# Patient Record
Sex: Female | Born: 1960 | Race: White | Hispanic: No | Marital: Married | Smoking: Never smoker
Health system: Southern US, Community
[De-identification: ages and names within clinical notes are randomized; demographics above are authoritative.]

## PROBLEM LIST (undated history)

## (undated) DIAGNOSIS — F419 Anxiety disorder, unspecified: Secondary | ICD-10-CM

## (undated) HISTORY — PX: ANKLE SURGERY: SHX546

## (undated) HISTORY — PX: BACK SURGERY: SHX140

---

## 2004-02-21 ENCOUNTER — Encounter: Admission: RE | Admit: 2004-02-21 | Discharge: 2004-02-21 | Payer: Self-pay | Admitting: Family Medicine

## 2005-04-09 ENCOUNTER — Ambulatory Visit (HOSPITAL_COMMUNITY): Admission: RE | Admit: 2005-04-09 | Discharge: 2005-04-09 | Payer: Self-pay | Admitting: Family Medicine

## 2006-01-29 ENCOUNTER — Other Ambulatory Visit: Admission: RE | Admit: 2006-01-29 | Discharge: 2006-01-29 | Payer: Self-pay | Admitting: Family Medicine

## 2006-09-22 ENCOUNTER — Emergency Department (HOSPITAL_COMMUNITY): Admission: EM | Admit: 2006-09-22 | Discharge: 2006-09-22 | Payer: Self-pay | Admitting: Family Medicine

## 2008-08-06 ENCOUNTER — Emergency Department (HOSPITAL_COMMUNITY): Admission: EM | Admit: 2008-08-06 | Discharge: 2008-08-06 | Payer: Self-pay | Admitting: Emergency Medicine

## 2008-08-22 ENCOUNTER — Encounter: Payer: Self-pay | Admitting: Pulmonary Disease

## 2008-09-06 DIAGNOSIS — J309 Allergic rhinitis, unspecified: Secondary | ICD-10-CM | POA: Insufficient documentation

## 2008-09-06 DIAGNOSIS — F329 Major depressive disorder, single episode, unspecified: Secondary | ICD-10-CM

## 2008-09-07 ENCOUNTER — Ambulatory Visit: Payer: Self-pay | Admitting: Pulmonary Disease

## 2008-09-14 ENCOUNTER — Telehealth: Payer: Self-pay | Admitting: Pulmonary Disease

## 2008-09-19 ENCOUNTER — Ambulatory Visit: Admission: RE | Admit: 2008-09-19 | Discharge: 2008-09-19 | Payer: Self-pay | Admitting: Pulmonary Disease

## 2008-09-19 ENCOUNTER — Ambulatory Visit: Payer: Self-pay | Admitting: Pulmonary Disease

## 2008-09-26 ENCOUNTER — Encounter: Admission: RE | Admit: 2008-09-26 | Discharge: 2008-12-01 | Payer: Self-pay | Admitting: Pulmonary Disease

## 2008-10-04 ENCOUNTER — Encounter: Payer: Self-pay | Admitting: Pulmonary Disease

## 2008-10-07 ENCOUNTER — Ambulatory Visit: Payer: Self-pay | Admitting: Pulmonary Disease

## 2008-10-07 DIAGNOSIS — K219 Gastro-esophageal reflux disease without esophagitis: Secondary | ICD-10-CM | POA: Insufficient documentation

## 2008-10-07 DIAGNOSIS — J383 Other diseases of vocal cords: Secondary | ICD-10-CM | POA: Insufficient documentation

## 2008-12-22 ENCOUNTER — Encounter: Payer: Self-pay | Admitting: Pulmonary Disease

## 2008-12-27 ENCOUNTER — Encounter: Admission: RE | Admit: 2008-12-27 | Discharge: 2008-12-27 | Payer: Self-pay | Admitting: Family Medicine

## 2009-01-23 ENCOUNTER — Ambulatory Visit (HOSPITAL_COMMUNITY): Admission: RE | Admit: 2009-01-23 | Discharge: 2009-01-24 | Payer: Self-pay | Admitting: Orthopaedic Surgery

## 2009-04-03 ENCOUNTER — Encounter: Admission: RE | Admit: 2009-04-03 | Discharge: 2009-04-03 | Payer: Self-pay | Admitting: Orthopaedic Surgery

## 2009-09-18 ENCOUNTER — Encounter: Admission: RE | Admit: 2009-09-18 | Discharge: 2009-09-18 | Payer: Self-pay | Admitting: Orthopaedic Surgery

## 2009-09-27 ENCOUNTER — Ambulatory Visit (HOSPITAL_COMMUNITY): Admission: RE | Admit: 2009-09-27 | Discharge: 2009-09-28 | Payer: Self-pay | Admitting: Orthopaedic Surgery

## 2009-11-30 ENCOUNTER — Inpatient Hospital Stay (HOSPITAL_COMMUNITY): Admission: RE | Admit: 2009-11-30 | Discharge: 2009-12-02 | Payer: Self-pay | Admitting: Orthopaedic Surgery

## 2010-04-18 ENCOUNTER — Other Ambulatory Visit: Admission: RE | Admit: 2010-04-18 | Discharge: 2010-04-18 | Payer: Self-pay | Admitting: Family Medicine

## 2011-02-17 LAB — BASIC METABOLIC PANEL
CO2: 26 mEq/L (ref 19–32)
Chloride: 106 mEq/L (ref 96–112)
GFR calc Af Amer: >60 mL/min (ref >60)
Sodium: 138 mEq/L (ref 135–145)

## 2011-02-17 LAB — CBC
Hemoglobin: 9.9 g/dL — ABNORMAL LOW (ref 12.0–15.0)
MCHC: 34.7 g/dL (ref 30.0–36.0)
MCV: 88.8 fL (ref 78.0–100.0)
RBC: 3.21 MIL/uL — ABNORMAL LOW (ref 3.87–5.11)

## 2011-02-24 IMAGING — CR DG LUMBAR SPINE 2-3V
2 series · 2 of 2 positions shown · non-contrast
Comparison: 09/27/2009.

CLINICAL DATA: 48-year-old female undergoing lumbar surgery.
Incorrect instrument/sponge count.

LUMBAR SPINE - 2-3 VIEW

[view not recorded (1 of 2)]
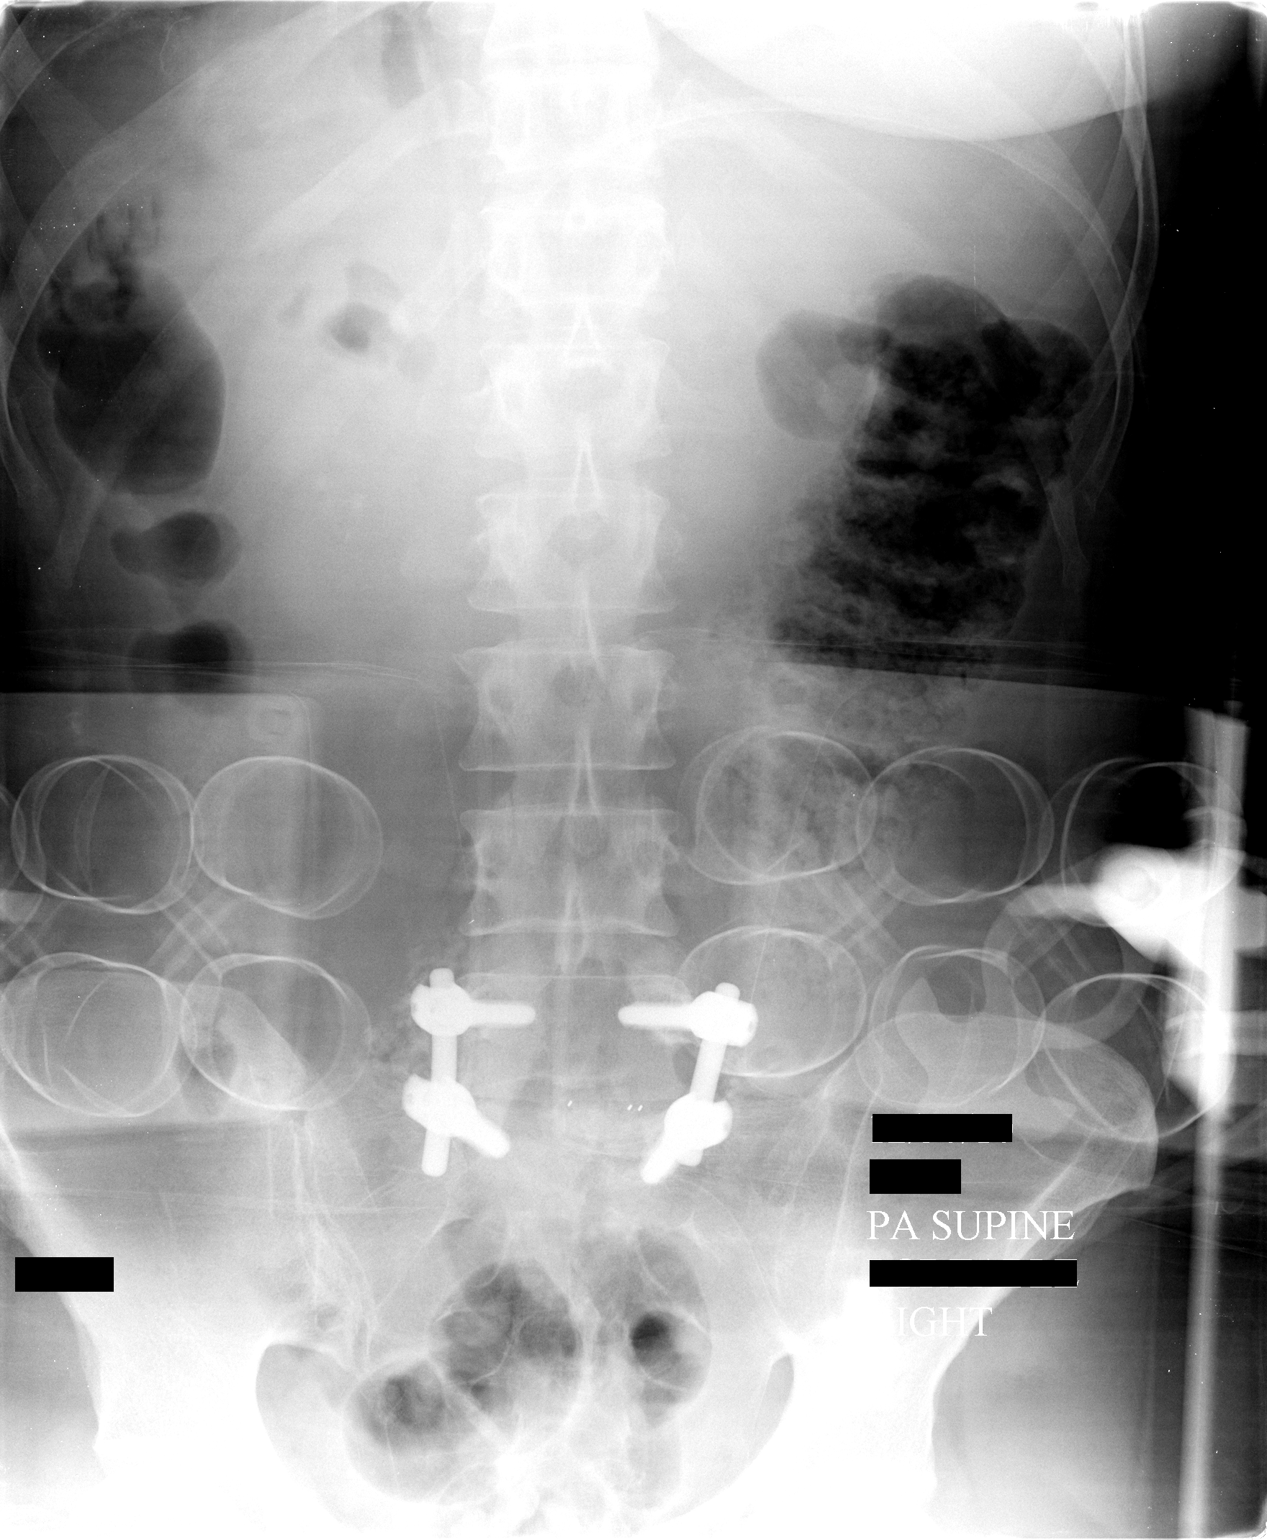

[view not recorded (2 of 2)]
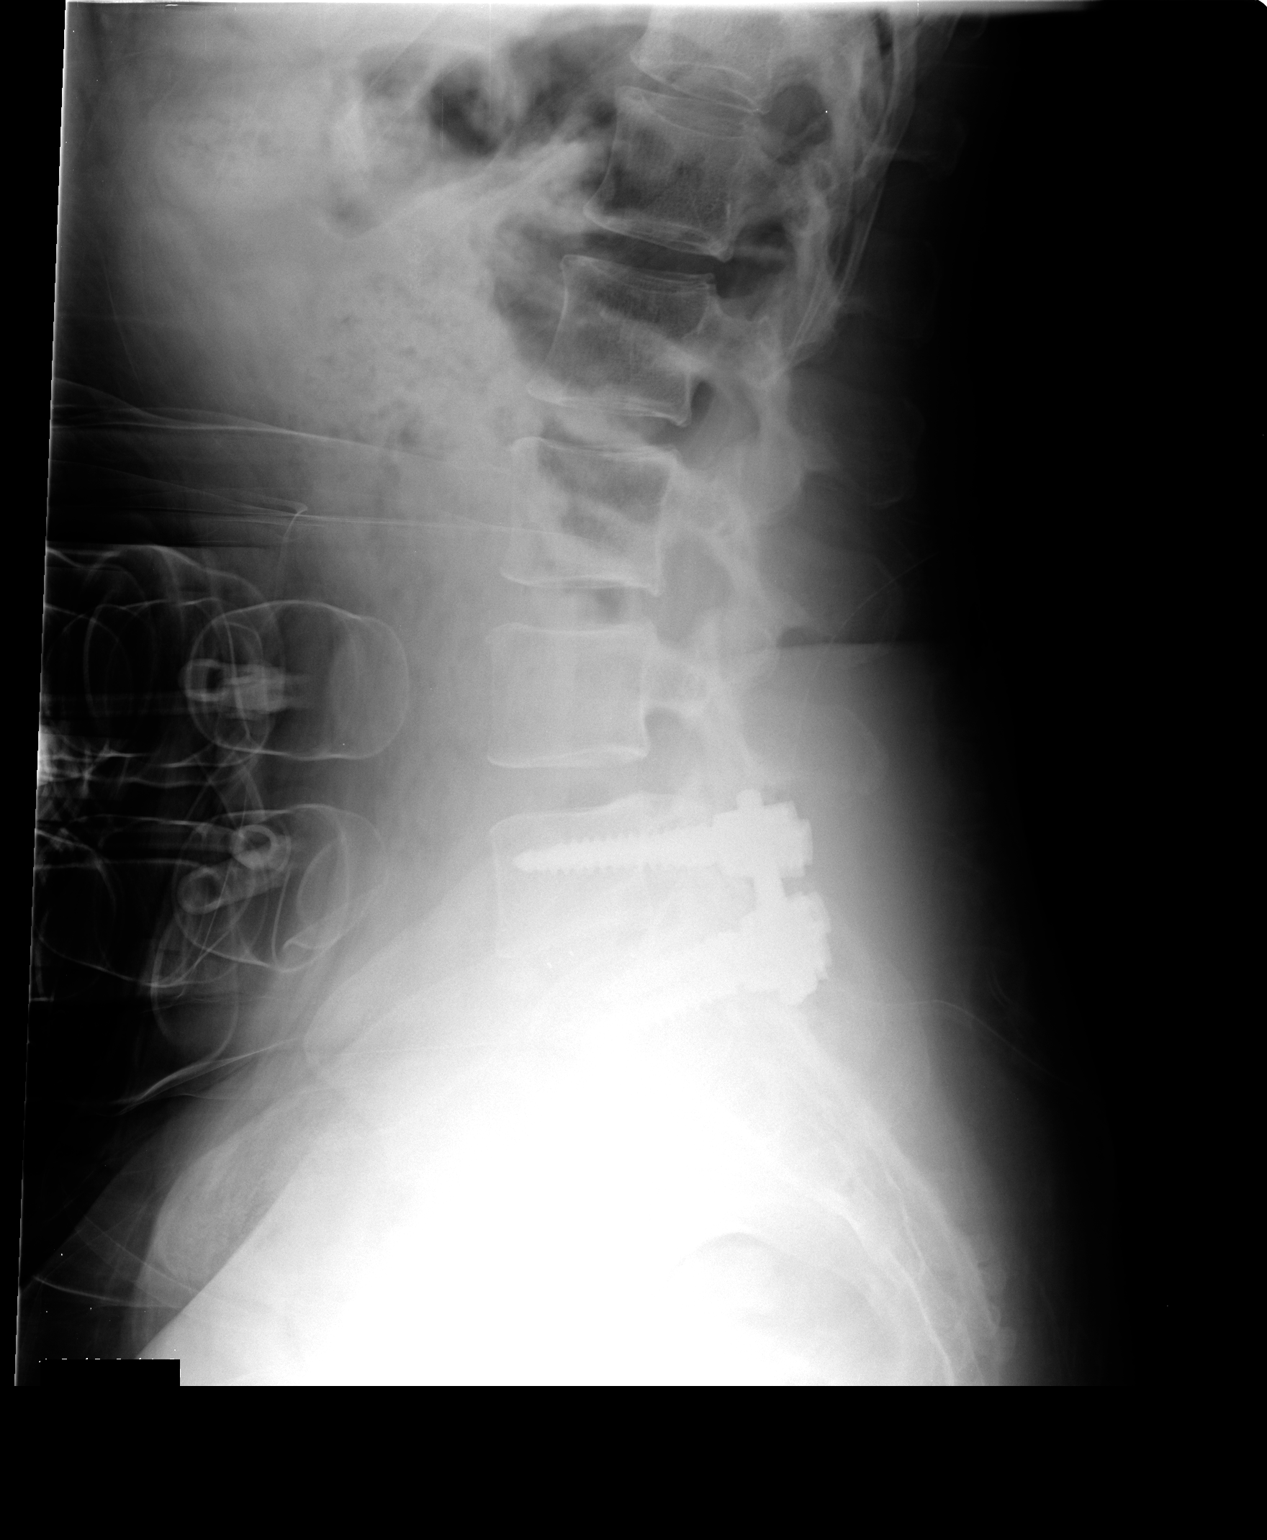

[2 of 2 positions shown; findings below may reference images not displayed]

FINDINGS: PA portable supine view of the lower abdomen and the
upper pelvis at 7711 hours.  Artifact from a warming device and
other external objects projects over the lower abdomen.  L5-S1
transpedicular hardware and interbody implant noted.  No retained
instrument.  No retained sponge identified.
IMPRESSION: 1.  Artifact due to patient warmer and other external objects
projects over the lower abdomen.
2.  No retained instrument or retained sponge identified.

## 2011-03-04 LAB — URINALYSIS, ROUTINE W REFLEX MICROSCOPIC
Glucose, UA: NEGATIVE mg/dL
Protein, ur: NEGATIVE mg/dL

## 2011-03-04 LAB — CBC
MCHC: 34.9 g/dL (ref 30.0–36.0)
MCV: 88 fL (ref 78.0–100.0)
Platelets: 224 10*3/uL (ref 150–400)
RDW: 13.7 % (ref 11.5–15.5)

## 2011-03-04 LAB — ABO/RH: ABO/RH(D): A NEG

## 2011-03-04 LAB — URINE MICROSCOPIC-ADD ON

## 2011-03-04 LAB — TYPE AND SCREEN
ABO/RH(D): A NEG
Antibody Screen: NEGATIVE

## 2011-03-04 LAB — COMPREHENSIVE METABOLIC PANEL
AST: 30 U/L (ref 0–37)
Albumin: 4 g/dL (ref 3.5–5.2)
Calcium: 9.3 mg/dL (ref 8.4–10.5)
Creatinine, Ser: 0.8 mg/dL (ref 0.4–1.2)
GFR calc Af Amer: >60 mL/min (ref >60)
GFR calc non Af Amer: >60 mL/min (ref >60)
Total Protein: 5.8 g/dL — ABNORMAL LOW (ref 6.0–8.3)

## 2011-03-04 LAB — BASIC METABOLIC PANEL
BUN: 5 mg/dL — ABNORMAL LOW (ref 6–23)
Calcium: 8.5 mg/dL (ref 8.4–10.5)
Creatinine, Ser: 0.8 mg/dL (ref 0.4–1.2)
GFR calc Af Amer: >60 mL/min (ref >60)
GFR calc non Af Amer: >60 mL/min (ref >60)

## 2011-03-04 LAB — HEMOGLOBIN AND HEMATOCRIT, BLOOD: Hemoglobin: 9.8 g/dL — ABNORMAL LOW (ref 12.0–15.0)

## 2011-03-07 LAB — BASIC METABOLIC PANEL
BUN: 11 mg/dL (ref 6–23)
Calcium: 9.4 mg/dL (ref 8.4–10.5)
Creatinine, Ser: 0.86 mg/dL (ref 0.4–1.2)
GFR calc non Af Amer: >60 mL/min (ref >60)
Glucose, Bld: 111 mg/dL — ABNORMAL HIGH (ref 70–99)

## 2011-03-07 LAB — CBC
HCT: 40.3 % (ref 36.0–46.0)
Platelets: 240 10*3/uL (ref 150–400)
RDW: 13.6 % (ref 11.5–15.5)

## 2011-03-19 LAB — CBC
Hemoglobin: 12.9 g/dL (ref 12.0–15.0)
RBC: 4.25 MIL/uL (ref 3.87–5.11)
RDW: 13.9 % (ref 11.5–15.5)
WBC: 3.6 10*3/uL — ABNORMAL LOW (ref 4.0–10.5)

## 2011-03-19 LAB — COMPREHENSIVE METABOLIC PANEL
ALT: 31 U/L (ref 0–35)
Alkaline Phosphatase: 110 U/L (ref 39–117)
Glucose, Bld: 100 mg/dL — ABNORMAL HIGH (ref 70–99)
Potassium: 3.6 mEq/L (ref 3.5–5.1)
Sodium: 140 mEq/L (ref 135–145)
Total Protein: 6 g/dL (ref 6.0–8.3)

## 2011-03-19 LAB — DIFFERENTIAL
Basophils Relative: 1 % (ref 0–1)
Eosinophils Absolute: 0.2 10*3/uL (ref 0.0–0.7)
Monocytes Absolute: 0.4 10*3/uL (ref 0.1–1.0)
Monocytes Relative: 11 % (ref 3–12)
Neutrophils Relative %: 45 % (ref 43–77)

## 2011-04-16 NOTE — Op Note (Signed)
Jill Wolfe, Jill Wolfe            ACCOUNT NO.:  [id number]   MEDICAL RECORD NO.:  [id number]          PATIENT TYPE:  AMB   LOCATION:  CARD                         FACILITY:  [facility]   PHYSICIAN:  Coralyn Helling, MD        DATE OF BIRTH:  Mar 28, 1961   DATE OF PROCEDURE:  09/19/2008  DATE OF DISCHARGE:                               OPERATIVE REPORT   PREOPERATIVE DIAGNOSIS:  Chronic cough, rule out vocal cord dysfunction.   POSTOPERATIVE DIAGNOSIS:  Chronic cough, rule out vocal cord  dysfunction.   PROCEDURE:  Bronchoscopy with airway inspection.   INDICATIONS:  Ms. Los is a 50 year old female who has had a cough  with upper throat irritation.  She is scheduled for bronchoscopy to  visualize the vocal cords as well as to exclude the possibility of extra-  thoracic or intra-thoracic lesion, as she did have trunk lesion on her  expiratory flu volume loop from her spirometry.   The procedure was explained to the patient.  The risks were detailed as  bleeding, pneumothorax, infection, and non-diagnosis.  The patient is  brought to the bronchoscopy suite.  She is given a total of 10 mg of  Versed and 50 mcg of fentanyl intravenously for sedation and analgesia.  She had Cetacaine spray applied to her posterior pharynx.  The  bronchoscope was entered orally.  The vocal cords are visualized.  There  did not appear to be any obvious lesion around her upper airway.  There  was some mild erythema of the posterior pharynx.  The vocal cords  appeared to have some chinking with inspiration.  A total of 10 ml of 2%  lidocaine were instilled for topical anesthesia.  The bronchoscope was  entered into the trachea.  The mucosa appeared normal.  The carina was  visualized and there is no obvious peribronchial lesion with no widening  of the carina.  The bronchoscope was then inserted into the left main  bronchus.  The left upper lingular and lower lobe orifices were well  visualized.  The mucosa  appeared normal.  There were no endobronchial  lesions.  The bronchoscope was then entered into the right main  bronchus.  The right upper, middle, and lower lobe orifices were  visualized.  There was no obvious endobronchial lesion.  The  bronchoscope was then withdrawn.  The patient tolerated the procedure  well.  She will return to the recovery room in stable condition.   PLAN:  Continue on her current sinus regimen as well as continue reflux  therapy.  In addition, she will need to follow up with speech therapy  for the treatment of her vocal cord dysfunction.      Coralyn Helling, MD  Electronically Signed     VS/MEDQ  D:  09/19/2008  T:  09/19/2008  Job:  [id number]

## 2011-04-16 NOTE — Op Note (Signed)
NAME:  Jill Wolfe, Jill Wolfe            ACCOUNT NO.:  [id number]   MEDICAL RECORD NO.:  [id number]          PATIENT TYPE:  OIB   LOCATION:  5021                         FACILITY:  [facility]   PHYSICIAN:  Mark C. Ophelia Charter, M.D.    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   PROCEDURE:  Right L5-S1 microdiskectomy and removal of herniated nucleus  pulposus with foraminotomy.   SURGEON:  Mark C. Ophelia Charter, MD   ASSISTANT:  Wende Neighbors, PA-C   ANESTHESIA:  GOT.   ESTIMATED BLOOD LOSS:  Minimal.   After induction of general anesthesia, the patient was placed in the  Archie frame.  Careful padding, positioning, surgical time-out,  checklist was completed.  Preoperative Ancef had been given.  Back was  prepped with DuraPrep.  The area was squared with towels, Betadine  biodrape applied, and needle localization with spinal needle.  Cross-  table lateral x-ray confirmed appropriate position with the needle just  below the L5-S1 disk space.  A small incision was made, Taylor retractor  was placed, and there was considerable overhang with the patient and  some excessive lordosis in the lumbar spine.  Laminotomy was performed.  A small amount of facet overhang was trimmed back to the level of the  pedicle and the disk was noted.  A second x-ray was taken.  After  confirmation, annulus was incised with scalpel.  Passes were made with  micropituitary and there was some disk material that had been extruded  right and central caudad underneath the ligament which was causing  significant nerve root compression.  Nerve was irritated and after disk  fragments were carefully teased out from under the ligament removed,  there was significant  improvement.  Foraminotomy was performed to give  the nerve root more room and after irrigation, passes were made with  Epstein curette in the midline and hockey-stick anterior to the dura  with no areas of compression.  Instrument count and needle count was  correct.  The patient was transferred to the recovery room.      Mark C. Ophelia Charter, M.D.  Electronically Signed     MCY/MEDQ  D:  01/23/2009  T:  01/24/2009  Job:  [id number]

## 2012-10-30 ENCOUNTER — Encounter (HOSPITAL_COMMUNITY): Payer: Self-pay | Admitting: Emergency Medicine

## 2012-10-30 ENCOUNTER — Emergency Department (HOSPITAL_COMMUNITY)
Admission: EM | Admit: 2012-10-30 | Discharge: 2012-10-30 | Disposition: A | Discharge status: Home / Self Care | Payer: BC Managed Care – PPO | Attending: Emergency Medicine | Admitting: Emergency Medicine

## 2012-10-30 ENCOUNTER — Emergency Department (HOSPITAL_COMMUNITY)
Admission: EM | Admit: 2012-10-30 | Discharge: 2012-10-30 | Disposition: A | Discharge status: Nursing Home (Includes ICF) | Payer: BC Managed Care – PPO | Source: Home / Self Care

## 2012-10-30 DIAGNOSIS — Z79899 Other long term (current) drug therapy: Secondary | ICD-10-CM | POA: Insufficient documentation

## 2012-10-30 DIAGNOSIS — G43909 Migraine, unspecified, not intractable, without status migrainosus: Secondary | ICD-10-CM

## 2012-10-30 DIAGNOSIS — R51 Headache: Secondary | ICD-10-CM | POA: Insufficient documentation

## 2012-10-30 DIAGNOSIS — R110 Nausea: Secondary | ICD-10-CM

## 2012-10-30 DIAGNOSIS — F411 Generalized anxiety disorder: Secondary | ICD-10-CM | POA: Insufficient documentation

## 2012-10-30 DIAGNOSIS — Z3202 Encounter for pregnancy test, result negative: Secondary | ICD-10-CM | POA: Insufficient documentation

## 2012-10-30 HISTORY — DX: Anxiety disorder, unspecified: F41.9

## 2012-10-30 LAB — URINALYSIS, ROUTINE W REFLEX MICROSCOPIC
Bilirubin Urine: NEGATIVE
Ketones, ur: 15 mg/dL — AB
Protein, ur: NEGATIVE mg/dL
Urobilinogen, UA: 0.2 mg/dL (ref 0.0–1.0)

## 2012-10-30 LAB — URINE MICROSCOPIC-ADD ON

## 2012-10-30 MED ORDER — HYDROMORPHONE HCL PF 1 MG/ML IJ SOLN
1.0000 mg | Freq: Once | INTRAMUSCULAR | Status: AC
  Administered 2012-10-30: 1 mg via INTRAVENOUS
  Filled 2012-10-30: qty 1

## 2012-10-30 MED ORDER — SODIUM CHLORIDE 0.9 % IV BOLUS (SEPSIS)
1000.0000 mL | Freq: Once | INTRAVENOUS | Status: AC
  Administered 2012-10-30: 1000 mL via INTRAVENOUS

## 2012-10-30 MED ORDER — DEXAMETHASONE SODIUM PHOSPHATE 10 MG/ML IJ SOLN
10.0000 mg | Freq: Once | INTRAMUSCULAR | Status: AC
  Administered 2012-10-30: 10 mg via INTRAVENOUS
  Filled 2012-10-30: qty 1

## 2012-10-30 MED ORDER — KETOROLAC TROMETHAMINE 30 MG/ML IJ SOLN
30.0000 mg | Freq: Once | INTRAMUSCULAR | Status: AC
  Administered 2012-10-30: 30 mg via INTRAVENOUS
  Filled 2012-10-30: qty 1

## 2012-10-30 MED ORDER — METOCLOPRAMIDE HCL 5 MG/ML IJ SOLN
20.0000 mg | Freq: Once | INTRAMUSCULAR | Status: AC
  Administered 2012-10-30: 20 mg via INTRAVENOUS
  Filled 2012-10-30: qty 4

## 2012-10-30 MED ORDER — BUTALBITAL-APAP-CAFFEINE 50-325-40 MG PO TABS: 1.0000 | ORAL_TABLET | Freq: Four times a day (QID) | ORAL | Status: DC | PRN

## 2012-10-30 MED ORDER — DIPHENHYDRAMINE HCL 50 MG/ML IJ SOLN
25.0000 mg | Freq: Once | INTRAMUSCULAR | Status: AC
  Administered 2012-10-30: 25 mg via INTRAVENOUS
  Filled 2012-10-30: qty 1

## 2012-10-30 NOTE — ED Notes (Signed)
Pt c/o a headache since 17:00.... Pt describes pain as constant and progressively getting worse... Sx include: nauseas, sensitive to light/noise... Denies: fevers, vomiting, diarrhea, blurry vision... Pt is alert w/no signs of acute distress.

## 2012-10-30 NOTE — ED Provider Notes (Signed)
History     CSN: [id number]  Arrival date & time 10/30/12  1927   None     Chief Complaint  Patient presents with  . Headache    (Consider location/radiation/quality/duration/timing/severity/associated sxs/prior treatment) The history is provided by the patient.  Patient complains of a 3 hour history of persistent unilateral headache. Character:  Throbbing Location: left sided Aggravating activities:  Movement, light Alleviating activities:  nothing No history of injury.  Not worst headache of life.  No radiation, dizziness, syncope, LOC, diplopia, loss of vision, aura, photophobia. + nausea, no vomiting.  No extremity weakness, numbness, or paresthesias Report pain began at 5pm this afternoon, started at 4/10 and rapidly progressed to 10/10.   Hx of migraines, last migraine 4-5 years ago.   Past Medical History  Diagnosis Date  . Anxiety     Past Surgical History  Procedure Date  . Back surgery   . Ankle surgery     No family history on file.  History  Substance Use Topics  . Smoking status: Never Smoker   . Smokeless tobacco: Not on file  . Alcohol Use: No    OB History    Grav Para Term Preterm Abortions TAB SAB Ect Mult Living                  Review of Systems  Neurological: Positive for headaches.  All other systems reviewed and are negative.    Allergies  Review of patient's allergies indicates no known allergies.  Home Medications   Current Outpatient Rx  Name  Route  Sig  Dispense  Refill  . DIVALPROEX SODIUM 500 MG PO TBEC   Oral   Take 500 mg by mouth 3 (three) times daily.         Marland Kitchen LAMOTRIGINE 100 MG PO TABS   Oral   Take 100 mg by mouth daily.           BP 142/98  Pulse 80  Temp 98.4 F (36.9 C) (Oral)  Resp 18  SpO2 97%  Physical Exam  Nursing note and vitals reviewed. Constitutional: She is oriented to person, place, and time. Vital signs are normal. She appears well-developed and well-nourished. She is active and  cooperative.  HENT:  Head: Normocephalic.  Right Ear: External ear normal.  Left Ear: External ear normal.  Nose: Nose normal.  Mouth/Throat: Oropharynx is clear and moist. No oropharyngeal exudate.  Eyes: Conjunctivae normal and EOM are normal. Pupils are equal, round, and reactive to light. No scleral icterus.       Painful EOM to left  Neck: Trachea normal and normal range of motion. Neck supple.  Cardiovascular: Normal rate, regular rhythm, normal heart sounds and intact distal pulses.   Pulmonary/Chest: Effort normal and breath sounds normal.  Musculoskeletal: Normal range of motion.  Lymphadenopathy:    She has no cervical adenopathy.  Neurological: She is alert and oriented to person, place, and time. She has normal strength and normal reflexes. No cranial nerve deficit or sensory deficit. Coordination and gait normal. GCS eye subscore is 4. GCS verbal subscore is 5. GCS motor subscore is 6.       MAE x 4, bilateral hand grips, no past pointing.  Skin: Skin is warm and dry.  Psychiatric: She has a normal mood and affect. Her speech is normal and behavior is normal. Judgment and thought content normal. Cognition and memory are normal.    ED Course  Procedures (including critical care time)  Labs  Reviewed - No data to display No results found.   1. Migraine   2. Nausea       MDM  Pt noted to have some difficulty with formulating words during interview, husband/significant other reports this is not different from normal.  Highly unlikely intracranial abnormality but given the rapid progression of migraine and last episode 4-5 years ago, further evaluation and treatment is warranted at [facility].  Pt informed and transferred.         Johnsie Kindred, NP 10/30/12 2042

## 2012-10-30 NOTE — ED Provider Notes (Signed)
Medical screening examination/treatment/procedure(s) were performed by non-physician practitioner and as supervising physician I was immediately available for consultation/collaboration.  Eliel Dudding, M.D.   Davena Julian C Kais Monje, MD 10/30/12 2045 

## 2012-10-30 NOTE — ED Provider Notes (Signed)
History     CSN: [id number]  Arrival date & time 10/30/12  2047   First MD Initiated Contact with Patient 10/30/12 2113      Chief Complaint  Patient presents with  . Headache    (Consider location/radiation/quality/duration/timing/severity/associated sxs/prior treatment) HPI  The patient presents with a headache.  Symptoms began approximately 3 or 4 hours prior to presentation.  Since onset there has been a throbbing right-sided, retro-orbital headache with radiation towards the posterior.  The pain is worse with light and motion.  The pain is minimally improved with OTC medication.  She denies significant nausea, vomiting, or any ataxia, confusion, disorientation, visual acuity changes.  She states that the headache is similar to innumerable prior headaches, though her last one was proximally 4 years ago.    Past Medical History  Diagnosis Date  . Anxiety     Past Surgical History  Procedure Date  . Back surgery   . Ankle surgery     No family history on file.  History  Substance Use Topics  . Smoking status: Never Smoker   . Smokeless tobacco: Not on file  . Alcohol Use: No    OB History    Grav Para Term Preterm Abortions TAB SAB Ect Mult Living                  Review of Systems  Constitutional:       Per HPI, otherwise negative  HENT:       Per HPI, otherwise negative  Eyes: Negative.   Respiratory:       Per HPI, otherwise negative  Cardiovascular:       Per HPI, otherwise negative  Gastrointestinal: Negative for vomiting.  Genitourinary: Negative.   Musculoskeletal:       Per HPI, otherwise negative  Skin: Negative.   Neurological: Negative for syncope.    Allergies  Review of patient's allergies indicates no known allergies.  Home Medications   Current Outpatient Rx  Name  Route  Sig  Dispense  Refill  . DIVALPROEX SODIUM 500 MG PO TBEC   Oral   Take 500 mg by mouth daily.          Marland Kitchen LAMOTRIGINE 100 MG PO TABS   Oral   Take 100  mg by mouth daily.           BP 153/87  Pulse 89  Temp 98.4 F (36.9 C) (Oral)  Resp 18  SpO2 100%  Physical Exam  Nursing note and vitals reviewed. Constitutional: She is oriented to person, place, and time. She appears well-developed and well-nourished. No distress.  HENT:  Head: Normocephalic and atraumatic.  Eyes: Conjunctivae normal and EOM are normal.  Cardiovascular: Normal rate and regular rhythm.   Pulmonary/Chest: Effort normal and breath sounds normal. No stridor. No respiratory distress.  Abdominal: She exhibits no distension.  Musculoskeletal: She exhibits no edema.  Neurological: She is alert and oriented to person, place, and time. She displays no atrophy and no tremor. No cranial nerve deficit or sensory deficit. She exhibits normal muscle tone. She displays no seizure activity. Coordination and gait normal.  Skin: Skin is warm and dry.  Psychiatric: She has a normal mood and affect.    ED Course  Procedures (including critical care time)   Labs Reviewed  URINALYSIS, ROUTINE W REFLEX MICROSCOPIC   No results found.   No diagnosis found.  10:47 PM Patient w minimal improvement following initial meds  11:25 PM Pain is 2/10 -  patient requests D/C  MDM  The patient presents with headache.  She has a history of migraines, states this is essentially the same type of pain as she has had innumerable prior times, but has not had a similar headache and some time in.  On exam the patient is neurologically intact, and in no distress.  His vital signs are unremarkable, and there is low suspicion for new acute intracranial pathology.  Given the improvement with medications, she was discharged in stable condition, per her request.  I recommended followup with primary care physician, and she was discharged with new analgesics.  Gerhard Munch, MD 10/30/12 2326

## 2012-10-30 NOTE — ED Notes (Signed)
C/o headache since 5pm today with nausea and light sensitivity.  Pt sent from [facility].

## 2012-11-01 LAB — URINE CULTURE
Colony Count: NO GROWTH
Culture: NO GROWTH

## 2013-04-24 ENCOUNTER — Encounter (HOSPITAL_COMMUNITY): Payer: Self-pay | Admitting: *Deleted

## 2013-04-24 ENCOUNTER — Emergency Department (HOSPITAL_COMMUNITY)
Admission: EM | Admit: 2013-04-24 | Discharge: 2013-04-24 | Disposition: A | Discharge status: Home / Self Care | Payer: BC Managed Care – PPO | Attending: Emergency Medicine | Admitting: Emergency Medicine

## 2013-04-24 DIAGNOSIS — Z981 Arthrodesis status: Secondary | ICD-10-CM | POA: Insufficient documentation

## 2013-04-24 DIAGNOSIS — M545 Low back pain, unspecified: Secondary | ICD-10-CM | POA: Insufficient documentation

## 2013-04-24 DIAGNOSIS — M549 Dorsalgia, unspecified: Secondary | ICD-10-CM

## 2013-04-24 DIAGNOSIS — Z79899 Other long term (current) drug therapy: Secondary | ICD-10-CM | POA: Insufficient documentation

## 2013-04-24 DIAGNOSIS — F411 Generalized anxiety disorder: Secondary | ICD-10-CM | POA: Insufficient documentation

## 2013-04-24 MED ORDER — HYDROMORPHONE HCL PF 1 MG/ML IJ SOLN
1.0000 mg | Freq: Once | INTRAMUSCULAR | Status: AC
  Administered 2013-04-24: 1 mg via INTRAMUSCULAR
  Filled 2013-04-24: qty 1

## 2013-04-24 MED ORDER — DIAZEPAM 5 MG PO TABS
5.0000 mg | ORAL_TABLET | Freq: Once | ORAL | Status: AC
  Administered 2013-04-24: 5 mg via ORAL
  Filled 2013-04-24: qty 1

## 2013-04-24 MED ORDER — DEXAMETHASONE SODIUM PHOSPHATE 10 MG/ML IJ SOLN
10.0000 mg | Freq: Once | INTRAMUSCULAR | Status: AC
  Administered 2013-04-24: 10 mg via INTRAMUSCULAR
  Filled 2013-04-24: qty 1

## 2013-04-24 MED ORDER — HYDROMORPHONE HCL 2 MG PO TABS: 2.0000 mg | ORAL_TABLET | Freq: Four times a day (QID) | ORAL | Status: AC | PRN

## 2013-04-24 MED ORDER — ONDANSETRON 4 MG PO TBDP
4.0000 mg | ORAL_TABLET | Freq: Once | ORAL | Status: AC
  Administered 2013-04-24: 4 mg via ORAL
  Filled 2013-04-24: qty 1

## 2013-04-24 NOTE — ED Provider Notes (Signed)
History     CSN: [id number]  Arrival date & time 04/24/13  1451   First MD Initiated Contact with Patient 04/24/13 1506      Chief Complaint  Patient presents with  . Back Pain    (Consider location/radiation/quality/duration/timing/severity/associated sxs/prior treatment) Patient is a 52 y.o. female presenting with back pain. The history is provided by the patient. No language interpreter was used.  Back Pain Location:  Gluteal region Quality:  Shooting and stabbing Radiates to:  R posterior upper leg Pain severity:  Severe Onset quality:  Sudden Duration:  3 hours Timing:  Constant Progression:  Worsening Chronicity:  Recurrent Relieved by:  Nothing Worsened by:  Ambulation and standing Ineffective treatments:  Narcotics and muscle relaxants Associated symptoms: no bladder incontinence, no bowel incontinence, no numbness and no perianal numbness   Patient has history of lumbar fusion by Dr. Ophelia Charter.  Reports doing well until about 6 weeks ago when she had a recurrence of pain.  Has seen Dr. Ophelia Charter, began physical therapy with mild improvement in symptoms.  However, she has had limited mobility at home due to continuing pain.  Sudden exacerbation today when attempting to stand from chair.  Denies numbness, tingling, urinary or bowel incontinence.  Past Medical History  Diagnosis Date  . Anxiety     Past Surgical History  Procedure Laterality Date  . Back surgery    . Ankle surgery      History reviewed. No pertinent family history.  History  Substance Use Topics  . Smoking status: Never Smoker   . Smokeless tobacco: Not on file  . Alcohol Use: No    OB History   Grav Para Term Preterm Abortions TAB SAB Ect Mult Living                  Review of Systems  Gastrointestinal: Negative for bowel incontinence.  Genitourinary: Negative for bladder incontinence.  Musculoskeletal: Positive for back pain.  Neurological: Negative for numbness.  All other systems  reviewed and are negative.    Allergies  Review of patient's allergies indicates no known allergies.  Home Medications   Current Outpatient Rx  Name  Route  Sig  Dispense  Refill  . divalproex (DEPAKOTE) 500 MG DR tablet   Oral   Take 500 mg by mouth daily.          Marland Kitchen HYDROmorphone (DILAUDID) 2 MG tablet   Oral   Take 2 mg by mouth every 4 (four) hours as needed for pain.         Marland Kitchen lamoTRIgine (LAMICTAL) 100 MG tablet   Oral   Take 100 mg by mouth daily.         . methocarbamol (ROBAXIN) 500 MG tablet   Oral   Take 500 mg by mouth every 6 (six) hours as needed.           BP 166/100  Pulse 115  Temp(Src) 98.2 F (36.8 C) (Oral)  Resp 18  SpO2 96%  Physical Exam  Nursing note and vitals reviewed. Constitutional: She is oriented to person, place, and time. She appears well-developed and well-nourished.  HENT:  Head: Normocephalic.  Eyes: Pupils are equal, round, and reactive to light.  Neck: Normal range of motion.  Cardiovascular: Normal rate and regular rhythm.   Pulmonary/Chest: Effort normal and breath sounds normal.  Abdominal: Soft.  Musculoskeletal: She exhibits tenderness.       Back:       Legs: Lymphadenopathy:    She  has no cervical adenopathy.  Neurological: She is alert and oriented to person, place, and time.  Skin: Skin is warm and dry. No rash noted.  Psychiatric: She has a normal mood and affect. Her behavior is normal. Judgment and thought content normal.    ED Course  Procedures (including critical care time)  Labs Reviewed - No data to display No results found.   No diagnosis found.  Exacerbation of back pain.  Feels better after medication, states she is ready to return home.  Patient to follow-up with Dr. Ophelia Charter early next week.  MDM          Jimmye Norman, NP 04/24/13 1752   Medical screening examination/treatment/procedure(s) were performed by non-physician practitioner and as supervising physician I was  immediately available for consultation/collaboration.   Derwood Kaplan, MD 04/25/13 [id number]

## 2013-04-24 NOTE — ED Notes (Signed)
Pt reports hx of back surgery, has been seeing dr  Ophelia Charter for her back. Went to get up from chair today and sudden onset of severe lower back pain that radiates to right leg. Denies numbness/tingling or incontinence.

## 2013-04-24 NOTE — ED Notes (Signed)
Pt reports nausea as she sits up for d/c. zofran per protocol ordered.

## 2013-04-27 ENCOUNTER — Other Ambulatory Visit: Payer: Self-pay | Admitting: Orthopaedic Surgery

## 2013-04-27 DIAGNOSIS — M79604 Pain in right leg: Secondary | ICD-10-CM

## 2013-04-27 DIAGNOSIS — M545 Low back pain: Secondary | ICD-10-CM

## 2013-04-28 ENCOUNTER — Ambulatory Visit
Admission: RE | Admit: 2013-04-28 | Discharge: 2013-04-28 | Disposition: A | Discharge status: Home / Self Care | Payer: BC Managed Care – PPO | Source: Ambulatory Visit | Attending: Orthopaedic Surgery | Admitting: Orthopaedic Surgery

## 2013-04-28 DIAGNOSIS — M545 Low back pain: Secondary | ICD-10-CM

## 2013-04-28 DIAGNOSIS — M79604 Pain in right leg: Secondary | ICD-10-CM

## 2013-04-28 MED ORDER — GADOBENATE DIMEGLUMINE 529 MG/ML IV SOLN
17.0000 mL | Freq: Once | INTRAVENOUS | Status: AC | PRN
  Administered 2013-04-28: 17 mL via INTRAVENOUS

## 2015-09-21 ENCOUNTER — Other Ambulatory Visit: Payer: Self-pay | Admitting: Physician Assistant

## 2020-04-06 ENCOUNTER — Ambulatory Visit: Payer: Self-pay | Attending: Internal Medicine

## 2020-04-06 DIAGNOSIS — Z23 Encounter for immunization: Secondary | ICD-10-CM

## 2020-04-06 NOTE — Progress Notes (Signed)
   Covid-19 Vaccination Clinic  Name:  Jill Wolfe    MRN: [id number] DOB: Aug 08, 1961  04/06/2020  [name] was observed post Covid-19 immunization for 15 minutes without incident. She was provided with Vaccine Information Sheet and instruction to access the V-Safe system.   [name] was instructed to call 911 with any severe reactions post vaccine: Marland Kitchen Difficulty breathing  . Swelling of face and throat  . A fast heartbeat  . A bad rash all over body  . Dizziness and weakness   Immunizations Administered    Name Date Dose VIS Date Route   Moderna COVID-19 Vaccine 04/06/2020  9:18 AM 0.5 mL 11/2019 Intramuscular   Manufacturer: Moderna   Lot: [id number]   NDC: 22633-354-56

## 2020-05-04 ENCOUNTER — Ambulatory Visit: Payer: Self-pay | Attending: Internal Medicine

## 2020-05-04 DIAGNOSIS — Z23 Encounter for immunization: Secondary | ICD-10-CM

## 2020-05-04 NOTE — Progress Notes (Signed)
   Covid-19 Vaccination Clinic  Name:  Jill Wolfe    MRN: [id number] DOB: June 06, 1961  05/04/2020  [name] was observed post Covid-19 immunization for 15 minutes without incident. She was provided with Vaccine Information Sheet and instruction to access the V-Safe system.   [name] was instructed to call 911 with any severe reactions post vaccine: Marland Kitchen Difficulty breathing  . Swelling of face and throat  . A fast heartbeat  . A bad rash all over body  . Dizziness and weakness   Immunizations Administered    Name Date Dose VIS Date Route   Moderna COVID-19 Vaccine 05/04/2020  9:07 AM 0.5 mL 11/2019 Intramuscular   Manufacturer: Moderna   Lot: [id number]   NDC: 86773-736-68

## 2020-11-29 ENCOUNTER — Encounter: Payer: Self-pay | Admitting: Emergency Medicine

## 2020-11-29 ENCOUNTER — Ambulatory Visit
Admission: EM | Admit: 2020-11-29 | Discharge: 2020-11-29 | Disposition: A | Discharge status: Home / Self Care | Payer: 59 | Attending: Family Medicine | Admitting: Family Medicine

## 2020-11-29 ENCOUNTER — Other Ambulatory Visit: Payer: Self-pay

## 2020-11-29 DIAGNOSIS — R059 Cough, unspecified: Secondary | ICD-10-CM | POA: Diagnosis not present

## 2020-11-29 DIAGNOSIS — J069 Acute upper respiratory infection, unspecified: Secondary | ICD-10-CM | POA: Diagnosis not present

## 2020-11-29 DIAGNOSIS — H66012 Acute suppurative otitis media with spontaneous rupture of ear drum, left ear: Secondary | ICD-10-CM | POA: Diagnosis not present

## 2020-11-29 MED ORDER — PROMETHAZINE-DM 6.25-15 MG/5ML PO SYRP: 5.0000 mL | ORAL_SOLUTION | Freq: Four times a day (QID) | ORAL | 0 refills | Status: DC | PRN

## 2020-11-29 MED ORDER — AMOXICILLIN-POT CLAVULANATE 875-125 MG PO TABS: 1.0000 | ORAL_TABLET | Freq: Two times a day (BID) | ORAL | 0 refills | Status: AC

## 2020-11-29 NOTE — Discharge Instructions (Addendum)
I have sent in Augmentin for you to take twice a day for 7 days.  I have sent in promethazine syrup for you to take for cough twice a day as needed  Follow up with this office or with primary care if symptoms are persisting.  Follow up in the ER for high fever, trouble swallowing, trouble breathing, other concerning symptoms.

## 2020-11-29 NOTE — ED Triage Notes (Signed)
Cough since Saturday and lt ear pain

## 2020-11-29 NOTE — ED Provider Notes (Signed)
[facility]ARE CENTER   [id number] 11/29/20 Arrival Time: 1005   CC: COVID symptoms  SUBJECTIVE: History from: patient.  Jill Wolfe is a 59 y.o. female who presents with abrupt onset of nasal congestion, PND, cough and left ear pain for the last 2 days.  Reports positive Covid exposure. Denies recent travel. Has negative history of Covid. Has completed Covid vaccines. Has not taken OTC medications for this. There are no aggravating or alleviating factors. Denies previous symptoms in the past. Denies fever, chills, fatigue, sinus pain, SOB, wheezing, chest pain, nausea, changes in bowel or bladder habits.    ROS: As per HPI.  All other pertinent ROS negative.     Past Medical History:  Diagnosis Date  . Anxiety    Past Surgical History:  Procedure Laterality Date  . ANKLE SURGERY    . BACK SURGERY     No Known Allergies No current facility-administered medications on file prior to encounter.   Current Outpatient Medications on File Prior to Encounter  Medication Sig Dispense Refill  . divalproex (DEPAKOTE) 500 MG DR tablet Take 500 mg by mouth daily.     Marland Kitchen HYDROmorphone (DILAUDID) 2 MG tablet Take 2 mg by mouth every 4 (four) hours as needed for pain.    Marland Kitchen HYDROmorphone (DILAUDID) 2 MG tablet Take 1 tablet (2 mg total) by mouth every 6 (six) hours as needed for pain. 12 tablet 0  . lamoTRIgine (LAMICTAL) 100 MG tablet Take 100 mg by mouth daily.    . methocarbamol (ROBAXIN) 500 MG tablet Take 500 mg by mouth every 6 (six) hours as needed.     Social History   Socioeconomic History  . Marital status: Married    Spouse name: Not on file  . Number of children: Not on file  . Years of education: Not on file  . Highest education level: Not on file  Occupational History  . Not on file  Tobacco Use  . Smoking status: Never Smoker  . Smokeless tobacco: Never Used  Substance and Sexual Activity  . Alcohol use: No  . Drug use: No  . Sexual activity: Not on file   Other Topics Concern  . Not on file  Social History Narrative  . Not on file   Social Determinants of Health   Financial Resource Strain: Not on file  Food Insecurity: Not on file  Transportation Needs: Not on file  Physical Activity: Not on file  Stress: Not on file  Social Connections: Not on file  Intimate Partner Violence: Not on file   No family history on file.  OBJECTIVE:  Vitals:   11/29/20 1123  BP: (!) 146/96  Pulse: 88  Resp: 16  Temp: 98.5 F (36.9 C)  TempSrc: Oral  SpO2: 95%     General appearance: alert; appears fatigued, but nontoxic; speaking in full sentences and tolerating own secretions HEENT: NCAT; Ears: EACs clear, TMs pearly gray; Eyes: PERRL.  EOM grossly intact. Sinuses: nontender; Nose: nares patent without rhinorrhea, Throat: oropharynx erythematous, cobblestoning present, tonsils non erythematous or enlarged, uvula midline  Neck: supple with LAD Lungs: unlabored respirations, symmetrical air entry; cough: moderate; no respiratory distress; CTAB Heart: regular rate and rhythm.  Radial pulses 2+ symmetrical bilaterally Skin: warm and dry Psychological: alert and cooperative; normal mood and affect  LABS:  No results found for this or any previous visit (from the past 24 hour(s)).   ASSESSMENT & PLAN:  1. Non-recurrent acute suppurative otitis media of left ear with  spontaneous rupture of tympanic membrane   2. Cough   3. Upper respiratory tract infection, unspecified type     Meds ordered this encounter  Medications  . amoxicillin-clavulanate (AUGMENTIN) 875-125 MG tablet    Sig: Take 1 tablet by mouth 2 (two) times daily for 7 days.    Dispense:  14 tablet    Refill:  0    Order Specific Question:   Supervising Provider    Answer:   Merrilee Jansky [id number]  . promethazine-dextromethorphan (PROMETHAZINE-DM) 6.25-15 MG/5ML syrup    Sig: Take 5 mLs by mouth 4 (four) times daily as needed for cough.    Dispense:  118 mL     Refill:  0    Order Specific Question:   Supervising Provider    Answer:   Merrilee Jansky [id number]   Prescribed Augmentin twice daily x7 days for ear infection Prescribed promethazine syrup twice daily as needed cough Sedation precautions given COVID and flu testing ordered.  It will take between 1-2 days for test results.  Someone will contact you regarding abnormal results.   Continue supportive care at home Patient should remain in quarantine until they have received Covid results.  If negative you may resume normal activities (go back to work/school) while practicing hand hygiene, social distance, and mask wearing.  If positive, patient should remain in quarantine for 10 days from symptom onset AND greater than 72 hours after symptoms resolution (absence of fever without the use of fever-reducing medication and improvement in respiratory symptoms), whichever is longer Get plenty of rest and push fluids Use OTC zyrtec for nasal congestion, runny nose, and/or sore throat Use OTC flonase for nasal congestion and runny nose Use medications daily for symptom relief Use OTC medications like ibuprofen or tylenol as needed fever or pain Call or go to the ED if you have any new or worsening symptoms such as fever, worsening cough, shortness of breath, chest tightness, chest pain, turning blue, changes in mental status.  Reviewed expectations re: course of current medical issues. Questions answered. Outlined signs and symptoms indicating need for more acute intervention. Patient verbalized understanding. After Visit Summary given.         Moshe Cipro, NP 11/29/20 1152

## 2020-11-30 LAB — COVID-19, FLU A+B NAA
Influenza A, NAA: NOT DETECTED
Influenza B, NAA: NOT DETECTED
SARS-CoV-2, NAA: NOT DETECTED

## 2020-12-11 ENCOUNTER — Ambulatory Visit
Admission: RE | Admit: 2020-12-11 | Discharge: 2020-12-11 | Disposition: A | Discharge status: Home / Self Care | Payer: 59 | Source: Ambulatory Visit | Attending: Emergency Medicine | Admitting: Emergency Medicine

## 2020-12-11 ENCOUNTER — Other Ambulatory Visit: Payer: Self-pay

## 2020-12-11 VITALS — BP 154/96 | HR 90 | Temp 99.1°F | Resp 15 | Ht 63.0 in | Wt 150.0 lb

## 2020-12-11 DIAGNOSIS — J208 Acute bronchitis due to other specified organisms: Secondary | ICD-10-CM | POA: Diagnosis not present

## 2020-12-11 MED ORDER — FLUTICASONE PROPIONATE 50 MCG/ACT NA SUSP: 1.0000 | Freq: Every day | NASAL | 0 refills | Status: DC

## 2020-12-11 MED ORDER — BENZONATATE 100 MG PO CAPS: 100.0000 mg | ORAL_CAPSULE | Freq: Three times a day (TID) | ORAL | 0 refills | Status: DC | PRN

## 2020-12-11 MED ORDER — ALBUTEROL SULFATE HFA 108 (90 BASE) MCG/ACT IN AERS: 1.0000 | INHALATION_SPRAY | Freq: Four times a day (QID) | RESPIRATORY_TRACT | 0 refills | Status: DC | PRN

## 2020-12-11 MED ORDER — PREDNISONE 10 MG PO TABS: 20.0000 mg | ORAL_TABLET | Freq: Every day | ORAL | 0 refills | Status: AC

## 2020-12-11 NOTE — ED Provider Notes (Addendum)
[facility] CARE CENTER   [id number] 12/11/20 Arrival Time: 1554   CC: URI  SUBJECTIVE: History from: patient.  Jill Wolfe is a 60 y.o. female who presented to the urgent care for complaint of cough, nasal congestion for the past 12 to 14 days.  Denies sick exposure to COVID, flu or strep.  Denies recent travel.  Has tried Augmentin and Promethazine DM without  relief.  Denies alleviating or aggravating factors.  Denies previous symptoms in the past.   Denies fever, chills, fatigue, sinus pain, rhinorrhea, sore throat, SOB, wheezing, chest pain, nausea, changes in bowel or bladder habits.     ROS: As per HPI.  All other pertinent ROS negative.       Past Medical History:  Diagnosis Date  . Anxiety    Past Surgical History:  Procedure Laterality Date  . ANKLE SURGERY    . BACK SURGERY     No Known Allergies No current facility-administered medications on file prior to encounter.   Current Outpatient Medications on File Prior to Encounter  Medication Sig Dispense Refill  . divalproex (DEPAKOTE) 500 MG DR tablet Take 500 mg by mouth daily.     Marland Kitchen HYDROmorphone (DILAUDID) 2 MG tablet Take 2 mg by mouth every 4 (four) hours as needed for pain.    Marland Kitchen HYDROmorphone (DILAUDID) 2 MG tablet Take 1 tablet (2 mg total) by mouth every 6 (six) hours as needed for pain. 12 tablet 0  . lamoTRIgine (LAMICTAL) 100 MG tablet Take 100 mg by mouth daily.    . methocarbamol (ROBAXIN) 500 MG tablet Take 500 mg by mouth every 6 (six) hours as needed.    . promethazine-dextromethorphan (PROMETHAZINE-DM) 6.25-15 MG/5ML syrup Take 5 mLs by mouth 4 (four) times daily as needed for cough. 118 mL 0   Social History   Socioeconomic History  . Marital status: Married    Spouse name: Not on file  . Number of children: Not on file  . Years of education: Not on file  . Highest education level: Not on file  Occupational History  . Not on file  Tobacco Use  . Smoking status: Never Smoker  .  Smokeless tobacco: Never Used  Substance and Sexual Activity  . Alcohol use: No  . Drug use: No  . Sexual activity: Not on file  Other Topics Concern  . Not on file  Social History Narrative  . Not on file   Social Determinants of Health   Financial Resource Strain: Not on file  Food Insecurity: Not on file  Transportation Needs: Not on file  Physical Activity: Not on file  Stress: Not on file  Social Connections: Not on file  Intimate Partner Violence: Not on file   History reviewed. No pertinent family history.  OBJECTIVE:  Vitals:   12/11/20 1631 12/11/20 1642  BP: (!) 154/96   Pulse: 90   Resp: 15   Temp: 99.1 F (37.3 C)   SpO2: 98%   Weight:  150 lb (68 kg)  Height:  5\' 3"  (1.6 m)     General appearance: alert; appears fatigued, but nontoxic; speaking in full sentences and tolerating own secretions HEENT: NCAT; Ears: Bilateral TM with middle ear effusion; Eyes: PERRL.  EOM grossly intact. Sinuses: nontender; Nose: nares patent without rhinorrhea, Throat: oropharynx clear, tonsils non erythematous or enlarged, uvula midline  Neck: supple without LAD Lungs: unlabored respirations, symmetrical air entry; cough: moderate; no respiratory distress; CTAB Heart: regular rate and rhythm.  Radial pulses 2+  symmetrical bilaterally Skin: warm and dry Psychological: alert and cooperative; normal mood and affect  LABS:  No results found for this or any previous visit (from the past 24 hour(s)).   ASSESSMENT & PLAN:  1. Acute bronchitis due to other specified organisms     Meds ordered this encounter  Medications  . predniSONE (DELTASONE) 10 MG tablet    Sig: Take 2 tablets (20 mg total) by mouth daily.    Dispense:  15 tablet    Refill:  0  . albuterol (VENTOLIN HFA) 108 (90 Base) MCG/ACT inhaler    Sig: Inhale 1-2 puffs into the lungs every 6 (six) hours as needed for wheezing or shortness of breath.    Dispense:  18 g    Refill:  0  . benzonatate (TESSALON)  100 MG capsule    Sig: Take 1 capsule (100 mg total) by mouth 3 (three) times daily as needed for cough.    Dispense:  30 capsule    Refill:  0  . fluticasone (FLONASE) 50 MCG/ACT nasal spray    Sig: Place 1 spray into both nostrils daily for 14 days.    Dispense:  16 g    Refill:  0    Discharge Instructions    Get plenty of rest and push fluids Tessalon Perles prescribed for cough Prednisone was prescribed ProAir was prescribed Flonase for middle ear effusion Use medications daily for symptom relief Use OTC medications like ibuprofen or tylenol as needed fever or pain Call or go to the ED if you have any new or worsening symptoms such as fever, worsening cough, shortness of breath, chest tightness, chest pain, turning blue, changes in mental status, etc...   Reviewed expectations re: course of current medical issues. Questions answered. Outlined signs and symptoms indicating need for more acute intervention. Patient verbalized understanding. After Visit Summary given.         Durward Parcel, FNP 12/11/20 1710    Durward Parcel, FNP 12/11/20 1712    Durward Parcel, FNP 12/11/20 1714

## 2020-12-11 NOTE — ED Triage Notes (Signed)
Cough and congestion since Christmas.  States she burst her ear drum on the left side and she still cannot hear from that side, states her ear is ringing.

## 2020-12-11 NOTE — Discharge Instructions (Addendum)
Get plenty of rest and push fluids Tessalon Perles prescribed for cough Prednisone was prescribed ProAir was prescribed Flonase for middle ear effusion Use medications daily for symptom relief Use OTC medications like ibuprofen or tylenol as needed fever or pain Call or go to the ED if you have any new or worsening symptoms such as fever, worsening cough, shortness of breath, chest tightness, chest pain, turning blue, changes in mental status, etc..Marland Kitchen

## 2022-10-01 ENCOUNTER — Ambulatory Visit
Admission: RE | Admit: 2022-10-01 | Discharge: 2022-10-01 | Disposition: A | Discharge status: Home / Self Care | Payer: 59 | Source: Ambulatory Visit | Attending: Nurse Practitioner | Admitting: Nurse Practitioner

## 2022-10-01 VITALS — BP 137/85 | HR 102 | Temp 99.9°F | Resp 20

## 2022-10-01 DIAGNOSIS — Z8709 Personal history of other diseases of the respiratory system: Secondary | ICD-10-CM

## 2022-10-01 DIAGNOSIS — J209 Acute bronchitis, unspecified: Secondary | ICD-10-CM

## 2022-10-01 MED ORDER — FLUTICASONE PROPIONATE 50 MCG/ACT NA SUSP: 2.0000 | Freq: Every day | NASAL | 0 refills | Status: AC

## 2022-10-01 MED ORDER — ALBUTEROL SULFATE (2.5 MG/3ML) 0.083% IN NEBU
2.5000 mg | INHALATION_SOLUTION | RESPIRATORY_TRACT | Status: AC
  Administered 2022-10-01: 2.5 mg via RESPIRATORY_TRACT

## 2022-10-01 MED ORDER — PROMETHAZINE-DM 6.25-15 MG/5ML PO SYRP: 5.0000 mL | ORAL_SOLUTION | Freq: Four times a day (QID) | ORAL | 0 refills | Status: AC | PRN

## 2022-10-01 MED ORDER — BENZONATATE 100 MG PO CAPS: 100.0000 mg | ORAL_CAPSULE | Freq: Three times a day (TID) | ORAL | 0 refills | Status: AC | PRN

## 2022-10-01 MED ORDER — ALBUTEROL SULFATE HFA 108 (90 BASE) MCG/ACT IN AERS: 2.0000 | INHALATION_SPRAY | Freq: Four times a day (QID) | RESPIRATORY_TRACT | 0 refills | Status: AC | PRN

## 2022-10-01 NOTE — ED Triage Notes (Signed)
Pt reports cough and congestion that she cannot get rid of for a week. Lost her voice. Took nyquil, dayquil, alkelzter,musinex, which provided no relief

## 2022-10-01 NOTE — Discharge Instructions (Addendum)
Take medication as prescribed.  You have been prescribed medication for your cough for daytime and for nighttime. Increase fluids and allow for plenty of rest. Recommend using a humidifier at bedtime to help with cough.  Also recommend sleeping elevated on 2 pillows while cough symptoms persist. Continue your current allergy medication as postnasal drainage can aggravate your cough. Recommend using over-the-counter cough drops and throat lozenges while cough symptoms persist. Please be advised that your cough can persist for several weeks.  Follow-up immediately if you develop wheezing, shortness of breath, difficulty breathing, or have a fever.  If your cough is lingering, but you do not feel bad, please consider continued use of throat lozenges, cough drops, and increase your fluids. Follow-up with your primary care physician if symptoms fail to improve.

## 2022-10-01 NOTE — ED Provider Notes (Signed)
[facility]    CSN: [id number] Arrival date & time: 10/01/22  1101      History   Chief Complaint Chief Complaint  Patient presents with   Cough    Cough and congestion - Entered by patient    HPI Jill Wolfe is a 61 y.o. female.   The history is provided by the patient.   Patient presents for complaints of cough that has been present for the past week.  She states that she also has sore throat, but that is due to her cough.  She denies wheezing, shortness of breath, or difficulty breathing.  She states that she has also lost her voice, has a history of a vocal cord disorder.  She also has a history of seasonal allergies, she takes Claritin-D for her symptoms.  She states that she has been taking over-the-counter medications such as NyQuil, DayQuil, Alka-Seltzer, and Mucinex with minimal relief.  She denies history of smoking. Past Medical History:  Diagnosis Date   Anxiety     Patient Active Problem List   Diagnosis Date Noted   VOCAL CORD DISORDER 10/07/2008   GERD 10/07/2008   DEPRESSION 09/06/2008   ALLERGIC RHINITIS 09/06/2008    Past Surgical History:  Procedure Laterality Date   ANKLE SURGERY     BACK SURGERY      OB History   No obstetric history on file.      Home Medications    Prior to Admission medications   Medication Sig Start Date End Date Taking? Authorizing Provider  albuterol (VENTOLIN HFA) 108 (90 Base) MCG/ACT inhaler Inhale 2 puffs into the lungs every 6 (six) hours as needed for wheezing or shortness of breath. 10/01/22  Yes Deiondra Denley-Warren, Alda Lea, NP  benzonatate (TESSALON PERLES) 100 MG capsule Take 1 capsule (100 mg total) by mouth 3 (three) times daily as needed for cough. 10/01/22  Yes Christy Friede-Warren, Alda Lea, NP  fluticasone (FLONASE) 50 MCG/ACT nasal spray Place 2 sprays into both nostrils daily. 10/01/22  Yes Tawania Daponte-Warren, Alda Lea, NP  promethazine-dextromethorphan (PROMETHAZINE-DM) 6.25-15 MG/5ML syrup  Take 5 mLs by mouth 4 (four) times daily as needed for cough. 10/01/22  Yes Zahid Carneiro-Warren, Alda Lea, NP  divalproex (DEPAKOTE) 500 MG DR tablet Take 500 mg by mouth daily.     [provider]  HYDROmorphone (DILAUDID) 2 MG tablet Take 2 mg by mouth every 4 (four) hours as needed for pain.    [provider]  HYDROmorphone (DILAUDID) 2 MG tablet Take 1 tablet (2 mg total) by mouth every 6 (six) hours as needed for pain. 04/24/13   Etta Quill, NP  lamoTRIgine (LAMICTAL) 100 MG tablet Take 100 mg by mouth daily.    [provider]  methocarbamol (ROBAXIN) 500 MG tablet Take 500 mg by mouth every 6 (six) hours as needed.    [provider]  predniSONE (DELTASONE) 10 MG tablet Take 2 tablets (20 mg total) by mouth daily. 12/11/20   Avegno, Darrelyn Hillock, FNP    Family History History reviewed. No pertinent family history.  Social History Social History   Tobacco Use   Smoking status: Never   Smokeless tobacco: Never  Substance Use Topics   Alcohol use: No   Drug use: No     Allergies   Patient has no known allergies.   Review of Systems Review of Systems Per HPI  Physical Exam Triage Vital Signs ED Triage Vitals  Enc Vitals Group     BP 10/01/22 1112  137/85     Pulse Rate 10/01/22 1112 (!) 102     Resp 10/01/22 1112 20     Temp 10/01/22 1112 99.9 F (37.7 C)     Temp Source 10/01/22 1112 Oral     SpO2 10/01/22 1112 94 %     Weight --      Height --      Head Circumference --      Peak Flow --      Pain Score 10/01/22 1114 5     Pain Loc --      Pain Edu? --      Excl. in GC? --    No data found.  Updated Vital Signs BP 137/85 (BP Location: Right Arm)   Pulse (!) 102   Temp 99.9 F (37.7 C) (Oral)   Resp 20   SpO2 94%   Visual Acuity Right Eye Distance:   Left Eye Distance:   Bilateral Distance:    Right Eye Near:   Left Eye Near:    Bilateral Near:     Physical Exam Vitals and nursing note reviewed.  Constitutional:       General: She is not in acute distress.    Appearance: Normal appearance.  HENT:     Head: Normocephalic.     Right Ear: Tympanic membrane, ear canal and external ear normal.     Left Ear: Tympanic membrane, ear canal and external ear normal.     Nose: Congestion present.     Mouth/Throat:     Mouth: Mucous membranes are moist.     Pharynx: Posterior oropharyngeal erythema present. No oropharyngeal exudate.  Eyes:     Extraocular Movements: Extraocular movements intact.     Conjunctiva/sclera: Conjunctivae normal.     Pupils: Pupils are equal, round, and reactive to light.  Cardiovascular:     Rate and Rhythm: Normal rate and regular rhythm.     Pulses: Normal pulses.     Heart sounds: Normal heart sounds.  Pulmonary:     Effort: Pulmonary effort is normal. No respiratory distress.     Breath sounds: Normal breath sounds. No stridor. No wheezing, rhonchi or rales.  Abdominal:     General: Bowel sounds are normal.     Palpations: Abdomen is soft.     Tenderness: There is no abdominal tenderness.  Musculoskeletal:     Cervical back: Normal range of motion.  Lymphadenopathy:     Cervical: No cervical adenopathy.  Skin:    General: Skin is warm and dry.  Neurological:     General: No focal deficit present.     Mental Status: She is alert and oriented to person, place, and time.  Psychiatric:        Mood and Affect: Mood normal.        Behavior: Behavior normal.      UC Treatments / Results  Labs (all labs ordered are listed, but only abnormal results are displayed) Labs Reviewed - No data to display  EKG   Radiology No results found.  Procedures Procedures (including critical care time)  Medications Ordered in UC Medications  albuterol (PROVENTIL) (2.5 MG/3ML) 0.083% nebulizer solution 2.5 mg (2.5 mg Nebulization Given 10/01/22 1152)    Initial Impression / Assessment and Plan / UC Course  I have reviewed the triage vital signs and the nursing  notes.  Pertinent labs & imaging results that were available during my care of the patient were reviewed by me and considered in my medical  decision making (see chart for details).  Patient presents for complaints of cough that has been present for the past week.  On exam, cough sent.  Patient's vital signs are stable, she is in no acute.  Do not suspect bacterial etiology such as pneumonia, her lung sounds are clear, she is afebrile.  We will treat patient for acute bronchitis with an albuterol inhaler, benzonatate 100 mg, and Promethazine DM for nighttime cough.  Fluticasone nasal spray was provided to help with postnasal drainage which may be aggravating her cough.  Discussed viral etiology with the patient.  Patient was advised to continue current allergy regimen.  Patient verbalized understanding.  All questions were answered.  Patient is stable for discharge. Final Clinical Impressions(s) / UC Diagnoses   Final diagnoses:  Acute bronchitis, unspecified organism  History of allergic rhinitis     Discharge Instructions      Take medication as prescribed.  You have been prescribed medication for your cough for daytime and for nighttime. Increase fluids and allow for plenty of rest. Recommend using a humidifier at bedtime to help with cough.  Also recommend sleeping elevated on 2 pillows while cough symptoms persist. Continue your current allergy medication as postnasal drainage can aggravate your cough. Recommend using over-the-counter cough drops and throat lozenges while cough symptoms persist. Please be advised that your cough can persist for several weeks.  Follow-up immediately if you develop wheezing, shortness of breath, difficulty breathing, or have a fever.  If your cough is lingering, but you do not feel bad, please consider continued use of throat lozenges, cough drops, and increase your fluids. Follow-up with your primary care physician if symptoms fail to improve.     ED  Prescriptions     Medication Sig Dispense Auth. Provider   benzonatate (TESSALON PERLES) 100 MG capsule Take 1 capsule (100 mg total) by mouth 3 (three) times daily as needed for cough. 30 capsule Yamilee Harmes-Warren, Sadie Haber, NP   promethazine-dextromethorphan (PROMETHAZINE-DM) 6.25-15 MG/5ML syrup Take 5 mLs by mouth 4 (four) times daily as needed for cough. 140 mL Belita Warsame-Warren, Sadie Haber, NP   fluticasone (FLONASE) 50 MCG/ACT nasal spray Place 2 sprays into both nostrils daily. 16 g Delitha Elms-Warren, Sadie Haber, NP   albuterol (VENTOLIN HFA) 108 (90 Base) MCG/ACT inhaler Inhale 2 puffs into the lungs every 6 (six) hours as needed for wheezing or shortness of breath. 8 g Marleena Shubert-Warren, Sadie Haber, NP      PDMP not reviewed this encounter.   Abran Cantor, NP 10/01/22 [phone]

## 2023-12-15 DIAGNOSIS — M47812 Spondylosis without myelopathy or radiculopathy, cervical region: Secondary | ICD-10-CM | POA: Diagnosis not present

## 2023-12-17 DIAGNOSIS — M47812 Spondylosis without myelopathy or radiculopathy, cervical region: Secondary | ICD-10-CM | POA: Diagnosis not present

## 2023-12-22 DIAGNOSIS — M47812 Spondylosis without myelopathy or radiculopathy, cervical region: Secondary | ICD-10-CM | POA: Diagnosis not present

## 2024-01-19 DIAGNOSIS — Z1231 Encounter for screening mammogram for malignant neoplasm of breast: Secondary | ICD-10-CM | POA: Diagnosis not present

## 2024-01-26 DIAGNOSIS — E7800 Pure hypercholesterolemia, unspecified: Secondary | ICD-10-CM | POA: Diagnosis not present

## 2024-01-26 DIAGNOSIS — R7301 Impaired fasting glucose: Secondary | ICD-10-CM | POA: Diagnosis not present

## 2024-01-26 DIAGNOSIS — Z0000 Encounter for general adult medical examination without abnormal findings: Secondary | ICD-10-CM | POA: Diagnosis not present

## 2024-02-03 ENCOUNTER — Other Ambulatory Visit (HOSPITAL_COMMUNITY): Payer: Self-pay

## 2024-02-03 DIAGNOSIS — M25562 Pain in left knee: Secondary | ICD-10-CM | POA: Diagnosis not present

## 2024-02-03 DIAGNOSIS — M25561 Pain in right knee: Secondary | ICD-10-CM | POA: Diagnosis not present

## 2024-02-03 MED ORDER — NAPROXEN 500 MG PO TABS
500.0000 mg | ORAL_TABLET | Freq: Two times a day (BID) | ORAL | 0 refills | Status: AC | PRN
  Filled 2024-02-03: qty 60, 30d supply, fill #0

## 2024-02-13 ENCOUNTER — Other Ambulatory Visit (HOSPITAL_COMMUNITY): Payer: Self-pay

## 2024-02-16 DIAGNOSIS — M5412 Radiculopathy, cervical region: Secondary | ICD-10-CM | POA: Diagnosis not present

## 2024-03-08 DIAGNOSIS — M5412 Radiculopathy, cervical region: Secondary | ICD-10-CM | POA: Diagnosis not present

## 2024-03-08 DIAGNOSIS — M791 Myalgia, unspecified site: Secondary | ICD-10-CM | POA: Diagnosis not present

## 2024-03-08 DIAGNOSIS — M25561 Pain in right knee: Secondary | ICD-10-CM | POA: Diagnosis not present

## 2024-07-28 DIAGNOSIS — I10 Essential (primary) hypertension: Secondary | ICD-10-CM | POA: Diagnosis not present

## 2024-07-28 DIAGNOSIS — R070 Pain in throat: Secondary | ICD-10-CM | POA: Diagnosis not present

## 2024-07-28 DIAGNOSIS — U071 COVID-19: Secondary | ICD-10-CM | POA: Diagnosis not present

## 2024-08-05 DIAGNOSIS — M25562 Pain in left knee: Secondary | ICD-10-CM | POA: Diagnosis not present

## 2024-11-15 DIAGNOSIS — H2513 Age-related nuclear cataract, bilateral: Secondary | ICD-10-CM | POA: Diagnosis not present

## 2024-11-15 DIAGNOSIS — H31011 Macula scars of posterior pole (postinflammatory) (post-traumatic), right eye: Secondary | ICD-10-CM | POA: Diagnosis not present

## 2024-11-15 DIAGNOSIS — H43821 Vitreomacular adhesion, right eye: Secondary | ICD-10-CM | POA: Diagnosis not present

## 2024-11-15 DIAGNOSIS — H43812 Vitreous degeneration, left eye: Secondary | ICD-10-CM | POA: Diagnosis not present
